# Patient Record
Sex: Female | Born: 1935 | Race: White | Hispanic: No | Marital: Married | Smoking: Never smoker
Health system: Southern US, Community
[De-identification: ages and names within clinical notes are randomized; demographics above are authoritative.]

## PROBLEM LIST (undated history)

## (undated) DIAGNOSIS — I10 Essential (primary) hypertension: Secondary | ICD-10-CM

## (undated) DIAGNOSIS — F419 Anxiety disorder, unspecified: Secondary | ICD-10-CM

---

## 1998-12-06 ENCOUNTER — Emergency Department (HOSPITAL_COMMUNITY): Admission: EM | Admit: 1998-12-06 | Discharge: 1998-12-06 | Payer: Self-pay | Admitting: Emergency Medicine

## 1999-02-20 ENCOUNTER — Emergency Department (HOSPITAL_COMMUNITY): Admission: EM | Admit: 1999-02-20 | Discharge: 1999-02-20 | Payer: Self-pay

## 2012-01-29 DEATH — deceased

## 2013-11-12 ENCOUNTER — Other Ambulatory Visit: Payer: Self-pay | Admitting: *Deleted

## 2014-04-06 ENCOUNTER — Other Ambulatory Visit: Payer: Self-pay | Admitting: *Deleted

## 2015-04-27 ENCOUNTER — Encounter (HOSPITAL_BASED_OUTPATIENT_CLINIC_OR_DEPARTMENT_OTHER): Payer: Self-pay | Admitting: *Deleted

## 2015-04-27 ENCOUNTER — Emergency Department (HOSPITAL_BASED_OUTPATIENT_CLINIC_OR_DEPARTMENT_OTHER)
Admission: EM | Admit: 2015-04-27 | Discharge: 2015-04-27 | Disposition: A | Discharge status: Home / Self Care | Payer: Medicare Other | Attending: Emergency Medicine | Admitting: Emergency Medicine

## 2015-04-27 ENCOUNTER — Emergency Department (HOSPITAL_BASED_OUTPATIENT_CLINIC_OR_DEPARTMENT_OTHER): Payer: Medicare Other

## 2015-04-27 ENCOUNTER — Other Ambulatory Visit: Payer: Self-pay

## 2015-04-27 DIAGNOSIS — Z7982 Long term (current) use of aspirin: Secondary | ICD-10-CM | POA: Diagnosis not present

## 2015-04-27 DIAGNOSIS — R002 Palpitations: Secondary | ICD-10-CM | POA: Insufficient documentation

## 2015-04-27 DIAGNOSIS — I10 Essential (primary) hypertension: Secondary | ICD-10-CM | POA: Insufficient documentation

## 2015-04-27 DIAGNOSIS — Z7951 Long term (current) use of inhaled steroids: Secondary | ICD-10-CM | POA: Insufficient documentation

## 2015-04-27 DIAGNOSIS — F419 Anxiety disorder, unspecified: Secondary | ICD-10-CM | POA: Diagnosis not present

## 2015-04-27 DIAGNOSIS — R600 Localized edema: Secondary | ICD-10-CM | POA: Insufficient documentation

## 2015-04-27 HISTORY — DX: Essential (primary) hypertension: I10

## 2015-04-27 HISTORY — DX: Anxiety disorder, unspecified: F41.9

## 2015-04-27 LAB — TROPONIN I: Troponin I: <0.03 ng/mL (ref <0.031)

## 2015-04-27 LAB — CBC WITH DIFFERENTIAL/PLATELET
BASOS ABS: 0 10*3/uL (ref 0.0–0.1)
Basophils Relative: 1 % (ref 0–1)
EOS PCT: 1 % (ref 0–5)
Eosinophils Absolute: 0.1 10*3/uL (ref 0.0–0.7)
HCT: 40.1 % (ref 36.0–46.0)
Hemoglobin: 13.4 g/dL (ref 12.0–15.0)
LYMPHS PCT: 17 % (ref 12–46)
Lymphs Abs: 1.1 10*3/uL (ref 0.7–4.0)
MCH: 28.8 pg (ref 26.0–34.0)
MCHC: 33.4 g/dL (ref 30.0–36.0)
MCV: 86.2 fL (ref 78.0–100.0)
Monocytes Absolute: 0.5 10*3/uL (ref 0.1–1.0)
Monocytes Relative: 8 % (ref 3–12)
Neutro Abs: 4.7 10*3/uL (ref 1.7–7.7)
Neutrophils Relative %: 73 % (ref 43–77)
Platelets: 225 10*3/uL (ref 150–400)
RBC: 4.65 MIL/uL (ref 3.87–5.11)
RDW: 14.2 % (ref 11.5–15.5)
WBC: 6.4 10*3/uL (ref 4.0–10.5)

## 2015-04-27 LAB — BASIC METABOLIC PANEL
Anion gap: 10 (ref 5–15)
BUN: 26 mg/dL — ABNORMAL HIGH (ref 6–20)
CO2: 27 mmol/L (ref 22–32)
Calcium: 10.2 mg/dL (ref 8.9–10.3)
Chloride: 103 mmol/L (ref 101–111)
Creatinine, Ser: 1.48 mg/dL — ABNORMAL HIGH (ref 0.44–1.00)
GFR calc Af Amer: 38 mL/min — ABNORMAL LOW (ref >60)
GFR, EST NON AFRICAN AMERICAN: 32 mL/min — AB (ref >60)
GLUCOSE: 105 mg/dL — AB (ref 65–99)
POTASSIUM: 4.1 mmol/L (ref 3.5–5.1)
SODIUM: 140 mmol/L (ref 135–145)

## 2015-04-27 MED ORDER — SODIUM CHLORIDE 0.9 % IV BOLUS (SEPSIS)
500.0000 mL | Freq: Once | INTRAVENOUS | Status: AC
  Administered 2015-04-27: 500 mL via INTRAVENOUS

## 2015-04-27 NOTE — ED Provider Notes (Signed)
CSN: [id number]     Arrival date & time 04/27/15  1455 History   First MD Initiated Contact with Patient 04/27/15 1529     Chief Complaint  Patient presents with  . Palpitations     (Consider location/radiation/quality/duration/timing/severity/associated sxs/prior Treatment) Patient is a 81 y.o. female presenting with palpitations. The history is provided by the patient.  Palpitations Palpitations quality:  Regular Onset quality:  Sudden Duration:  5 minutes Timing:  Constant Progression:  Resolved Chronicity:  Recurrent Context comment:  When awaking from a deep sleep Relieved by:  Nothing Worsened by:  Nothing Ineffective treatments:  None tried Associated symptoms: no back pain, no chest pain, no cough, no dizziness, no nausea, no shortness of breath and no vomiting     Past Medical History  Diagnosis Date  . Hypertension   . Anxiety    History reviewed. No pertinent past surgical history. No family history on file. History  Substance Use Topics  . Smoking status: Never Smoker   . Smokeless tobacco: Not on file  . Alcohol Use: No   OB History    No data available     Review of Systems  Constitutional: Negative for fever and fatigue.  HENT: Negative for congestion and drooling.   Eyes: Negative for pain.  Respiratory: Negative for cough and shortness of breath.   Cardiovascular: Positive for palpitations. Negative for chest pain.  Gastrointestinal: Negative for nausea, vomiting, abdominal pain and diarrhea.  Genitourinary: Negative for dysuria and hematuria.  Musculoskeletal: Negative for back pain, gait problem and neck pain.  Skin: Negative for color change.  Neurological: Negative for dizziness and headaches.  Hematological: Negative for adenopathy.  Psychiatric/Behavioral: Negative for behavioral problems.  All other systems reviewed and are negative.     Allergies  Review of patient's allergies indicates no known allergies.  Home Medications    Prior to Admission medications   Medication Sig Start Date End Date Taking? Authorizing Provider  AMLODIPINE BESYLATE PO Take by mouth.   Yes Historical Provider, MD  aspirin 81 MG tablet Take 81 mg by mouth daily.   Yes Historical Provider, MD  DiazePAM (VALIUM PO) Take by mouth.   Yes Historical Provider, MD  fluticasone (VERAMYST) 27.5 MCG/SPRAY nasal spray Place 2 sprays into the nose daily.   Yes Historical Provider, MD  LISINOPRIL PO Take by mouth.   Yes Historical Provider, MD   BP 154/69 mmHg  Pulse 81  Temp(Src) 97.7 F (36.5 C) (Oral)  Resp 18  SpO2 99% Physical Exam  Constitutional: She is oriented to person, place, and time. She appears well-developed and well-nourished.  HENT:  Head: Normocephalic.  Mouth/Throat: Oropharynx is clear and moist. No oropharyngeal exudate.  Eyes: Conjunctivae and EOM are normal. Pupils are equal, round, and reactive to light.  Neck: Normal range of motion. Neck supple.  Cardiovascular: Normal rate, regular rhythm, normal heart sounds and intact distal pulses.  Exam reveals no gallop and no friction rub.   No murmur heard. Pulmonary/Chest: Effort normal and breath sounds normal. No respiratory distress. She has no wheezes.  Abdominal: Soft. Bowel sounds are normal. There is no tenderness. There is no rebound and no guarding.  Musculoskeletal: Normal range of motion. She exhibits edema (trace pitting edema in bilateral distal LE's). She exhibits no tenderness.  Neurological: She is alert and oriented to person, place, and time.  alert, oriented x3 speech: normal in context and clarity memory: intact grossly cranial nerves II-XII: intact motor strength: full proximally and distally no  involuntary movements or tremors sensation: intact to light touch diffusely  cerebellar: finger-to-nose and heel-to-shin intact gait: normal forwards and backwards  Skin: Skin is warm and dry.  Psychiatric: She has a normal mood and affect. Her behavior is  normal.  Nursing note and vitals reviewed.   ED Course  Procedures (including critical care time) Labs Review Labs Reviewed  BASIC METABOLIC PANEL - Abnormal; Notable for the following:    Glucose, Bld 105 (*)    BUN 26 (*)    Creatinine, Ser 1.48 (*)    GFR calc non Af Amer 32 (*)    GFR calc Af Amer 38 (*)    All other components within normal limits  CBC WITH DIFFERENTIAL/PLATELET  TROPONIN I    Imaging Review Dg Chest 2 View  04/27/2015   CLINICAL DATA:  Palpitations and upper extremity paresthesias when sleeping.  EXAM: CHEST  2 VIEW  COMPARISON:  None.  FINDINGS: The heart size and mediastinal contours are within normal limits. Both lungs are clear. The visualized skeletal structures are unremarkable.  IMPRESSION: No active cardiopulmonary disease.   Electronically Signed   By: Ellery Plunk M.D.   On: 04/27/2015 17:24     EKG Interpretation   Date/Time:  Thursday April 27 2015 15:02:07 EDT Ventricular Rate:  92 PR Interval:  124 QRS Duration: 82 QT Interval:  348 QTC Calculation: 430 R Axis:   47 Text Interpretation:  Normal sinus rhythm Nonspecific ST abnormality  Confirmed by Lezette Kitts  MD, Willoughby Doell ([id number]) on 04/27/2015 3:07:12 PM      MDM   Final diagnoses:  Palpitations    3:50 PM 81 y.o. female w hx of HTN, anxiety who presents with palpitations. She states that she took a nap from 10 AM to 11 AM this morning. She states that she woke suddenly with palpitations and some tingling in her hands which lasted about 5 minutes and then resolve spontaneously. She denies any cp or sob. She notes that she has had several similar episodes in the middle the night over the last few weeks. She also reports that she has an appointment with a cardiologist on August 5 and a sleep study planned on August 4. Her vital signs are unremarkable here. She appears well on exam. She has a normal neurologic exam. Currently asymptomatic. We'll get screening labs and imaging.  5:43 PM:  I interpreted/reviewed the labs and/or imaging which were non-contributory.  Mild renal insuff, but i don't know baseline. She got 1L IVF here. She remains asx on exam. I have discussed the diagnosis/risks/treatment options with the patient and believe the pt to be eligible for discharge home to follow-up with her cardiologist as scheduled. We also discussed returning to the ED immediately if new or worsening sx occur. We discussed the sx which are most concerning (e.g., cp, sob, worsening or persistent palpitations) that necessitate immediate return. Medications administered to the patient during their visit and any new prescriptions provided to the patient are listed below.  Medications given during this visit Medications  sodium chloride 0.9 % bolus 500 mL (0 mLs Intravenous Stopped 04/27/15 1701)  sodium chloride 0.9 % bolus 500 mL (500 mLs Intravenous New Bag/Given 04/27/15 1727)    New Prescriptions   No medications on file     Purvis Sheffield, MD 04/27/15 1743

## 2015-04-27 NOTE — ED Notes (Signed)
Pt states she has been waking up "for a while" with a feeling of "shakiness" in her chest and numbness in her hands.  Pt states once she wakes for about 5 minutes, she returns to feeling normal.  Pt has an appointment with a cardiologist on aug 5, and a sleep study appt on aug 4.  Pt has been awaken at night with this feeling before, but this morning after a short nap she woke up feeling the shakiness and was scared enough that she wanted to come get checked out.

## 2015-04-27 NOTE — ED Notes (Signed)
States when she goes into a deep sleep she wakes with palpitations and her hands are numb. She has an appointment for a sleep study. She had a carotid u/s and she has a 70% blockage in the left side of her neck.

## 2015-05-05 ENCOUNTER — Other Ambulatory Visit: Payer: Self-pay | Admitting: *Deleted

## 2015-05-05 ENCOUNTER — Encounter: Payer: Self-pay | Admitting: Vascular Surgery

## 2015-05-05 DIAGNOSIS — I6522 Occlusion and stenosis of left carotid artery: Secondary | ICD-10-CM

## 2015-05-25 ENCOUNTER — Encounter: Payer: Self-pay | Admitting: Vascular Surgery

## 2015-05-26 ENCOUNTER — Encounter (HOSPITAL_COMMUNITY): Payer: Medicare Other

## 2015-05-26 ENCOUNTER — Encounter: Payer: Medicare Other | Admitting: Vascular Surgery

## 2016-06-25 IMAGING — CR DG CHEST 2V
2 series · 2 of 2 positions shown · non-contrast
Comparison: None.

CLINICAL DATA: Palpitations and upper extremity paresthesias when
sleeping.

EXAM:
CHEST  2 VIEW

[w chest pa]
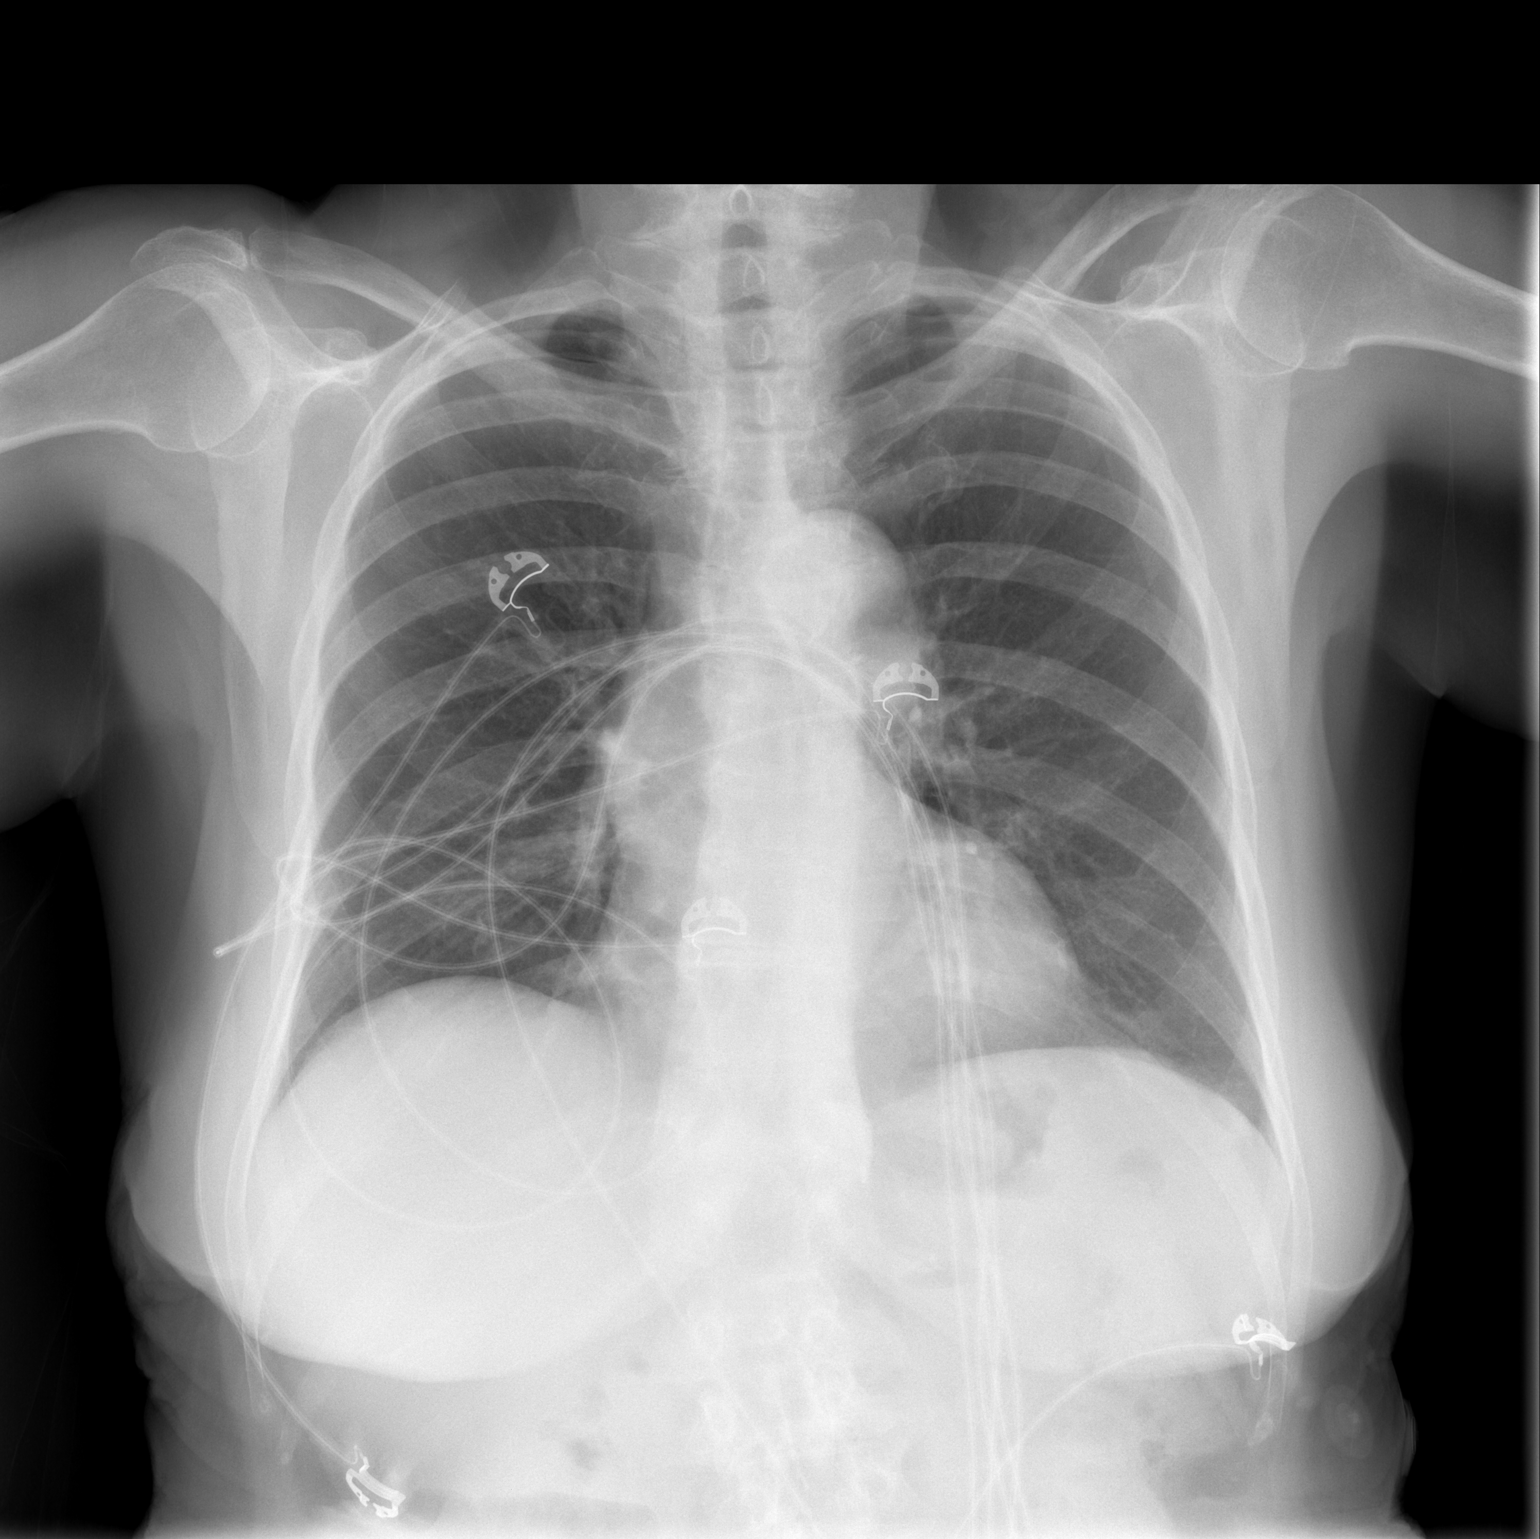

[w chest lat]
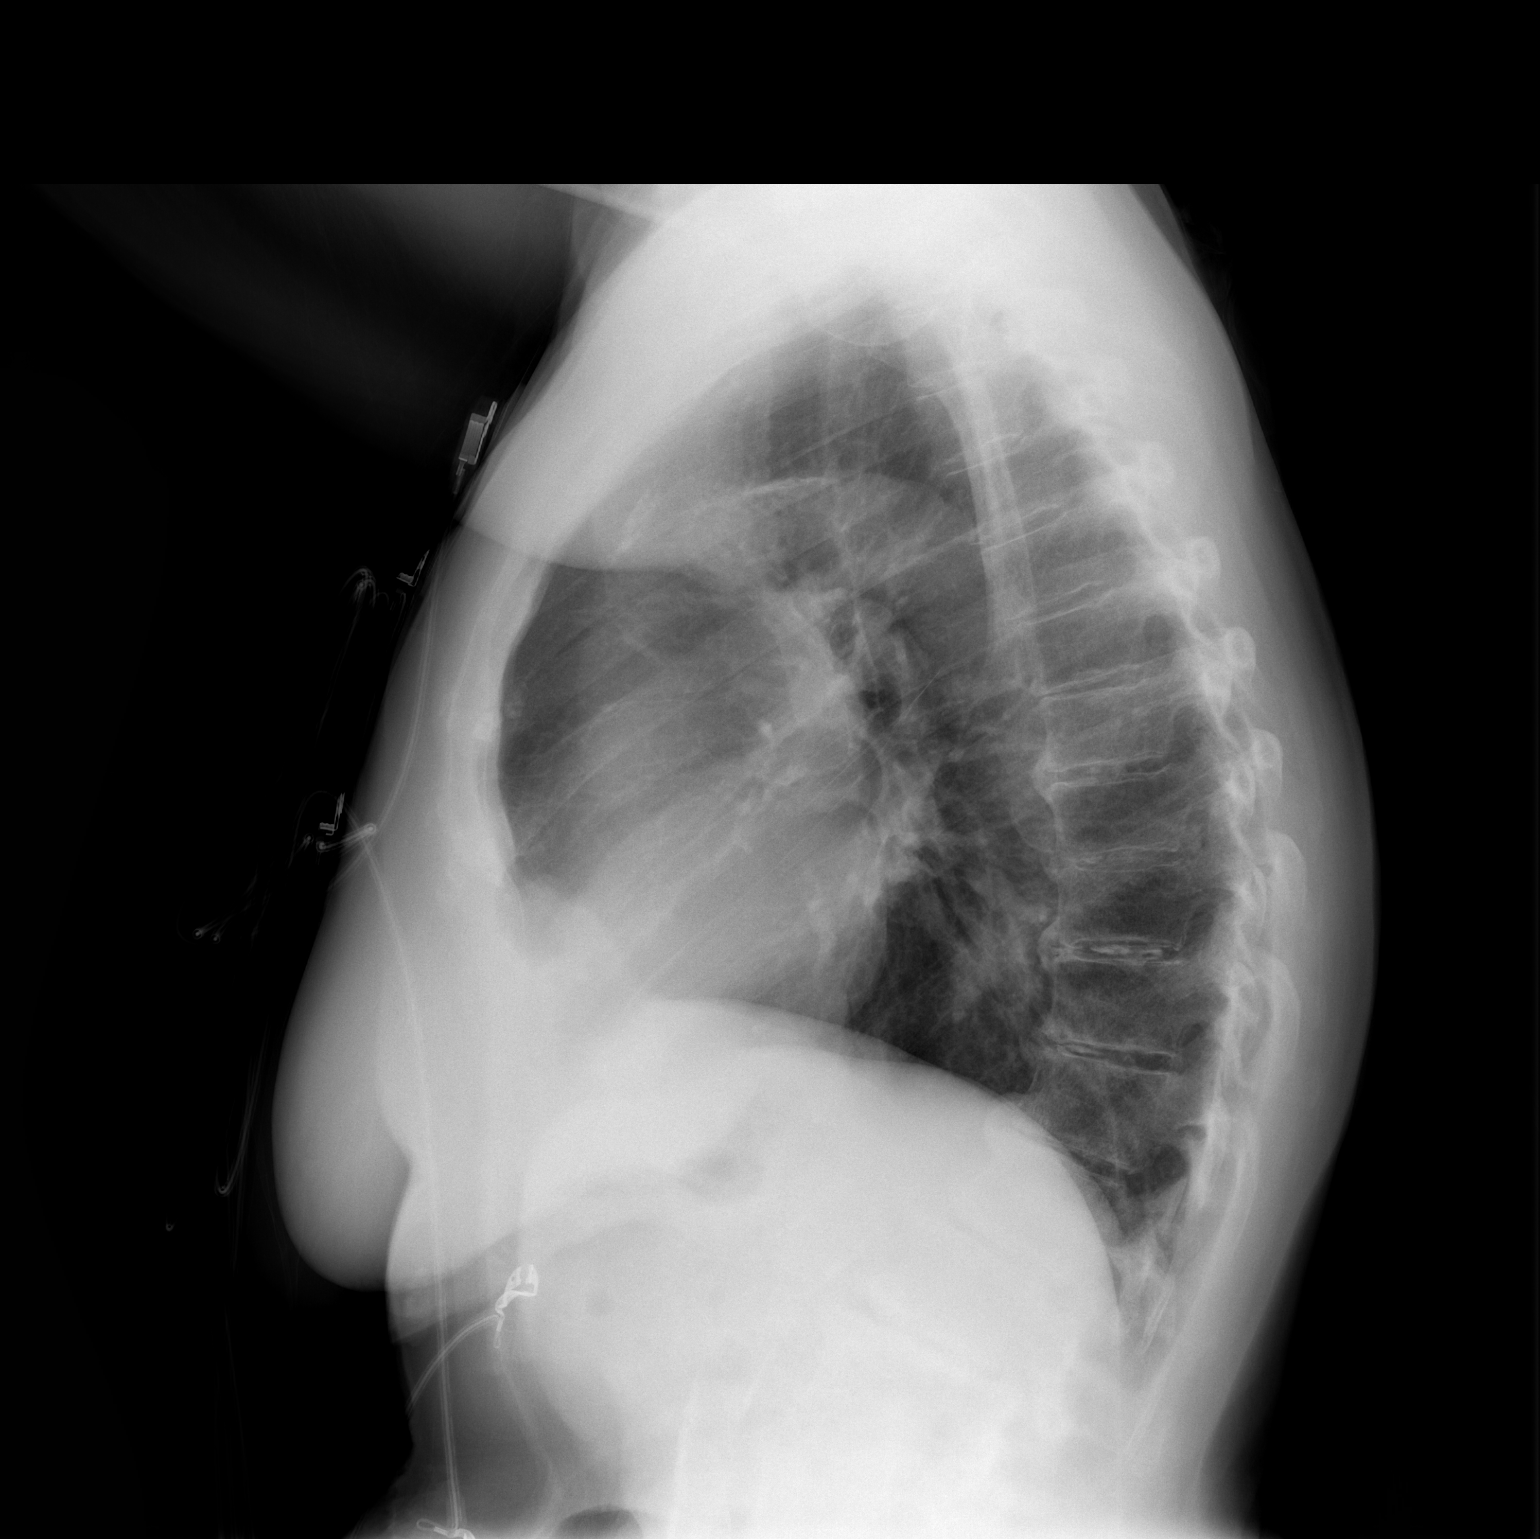

[2 of 2 positions shown; findings below may reference images not displayed]

FINDINGS: The heart size and mediastinal contours are within normal limits.
Both lungs are clear. The visualized skeletal structures are
unremarkable.
IMPRESSION: No active cardiopulmonary disease.

## 2022-01-20 DIAGNOSIS — L309 Dermatitis, unspecified: Secondary | ICD-10-CM | POA: Diagnosis not present
